# Patient Record
Sex: Female | Born: 1960 | Race: White | Hispanic: No | Smoking: Never smoker
Health system: Southern US, Community
[De-identification: ages and names within clinical notes are randomized; demographics above are authoritative.]

## PROBLEM LIST (undated history)

## (undated) DIAGNOSIS — M5430 Sciatica, unspecified side: Secondary | ICD-10-CM

---

## 2000-04-16 ENCOUNTER — Encounter: Payer: Self-pay | Admitting: Urology

## 2000-04-16 ENCOUNTER — Inpatient Hospital Stay (HOSPITAL_COMMUNITY): Admission: EM | Admit: 2000-04-16 | Discharge: 2000-04-17 | Payer: Self-pay | Admitting: Emergency Medicine

## 2000-04-16 ENCOUNTER — Encounter: Payer: Self-pay | Admitting: Emergency Medicine

## 2000-05-03 ENCOUNTER — Encounter: Payer: Self-pay | Admitting: Urology

## 2000-05-03 ENCOUNTER — Ambulatory Visit (HOSPITAL_BASED_OUTPATIENT_CLINIC_OR_DEPARTMENT_OTHER): Admission: RE | Admit: 2000-05-03 | Discharge: 2000-05-03 | Payer: Self-pay | Admitting: Urology

## 2005-08-06 ENCOUNTER — Emergency Department (HOSPITAL_COMMUNITY): Admission: EM | Admit: 2005-08-06 | Discharge: 2005-08-07 | Payer: Self-pay | Admitting: *Deleted

## 2005-08-09 ENCOUNTER — Inpatient Hospital Stay (HOSPITAL_COMMUNITY): Admission: AD | Admit: 2005-08-09 | Discharge: 2005-08-11 | Payer: Self-pay | Admitting: Orthopaedic Surgery

## 2006-07-12 IMAGING — CR DG ANKLE 2V *L*
2 series · 2 of 2 positions shown · non-contrast
Comparison: none

CLINICAL DATA: Fell with ankle pain. 
 LEFT ANKLE - 2 VIEW:
 Two views of the left ankle were obtained.  There is tibiotalar dislocation with fracture of the medial malleolus and possibly posterior malleolus.  There is oblique angulated fracture of the distal left fibula.

[view not recorded (1 of 2)]
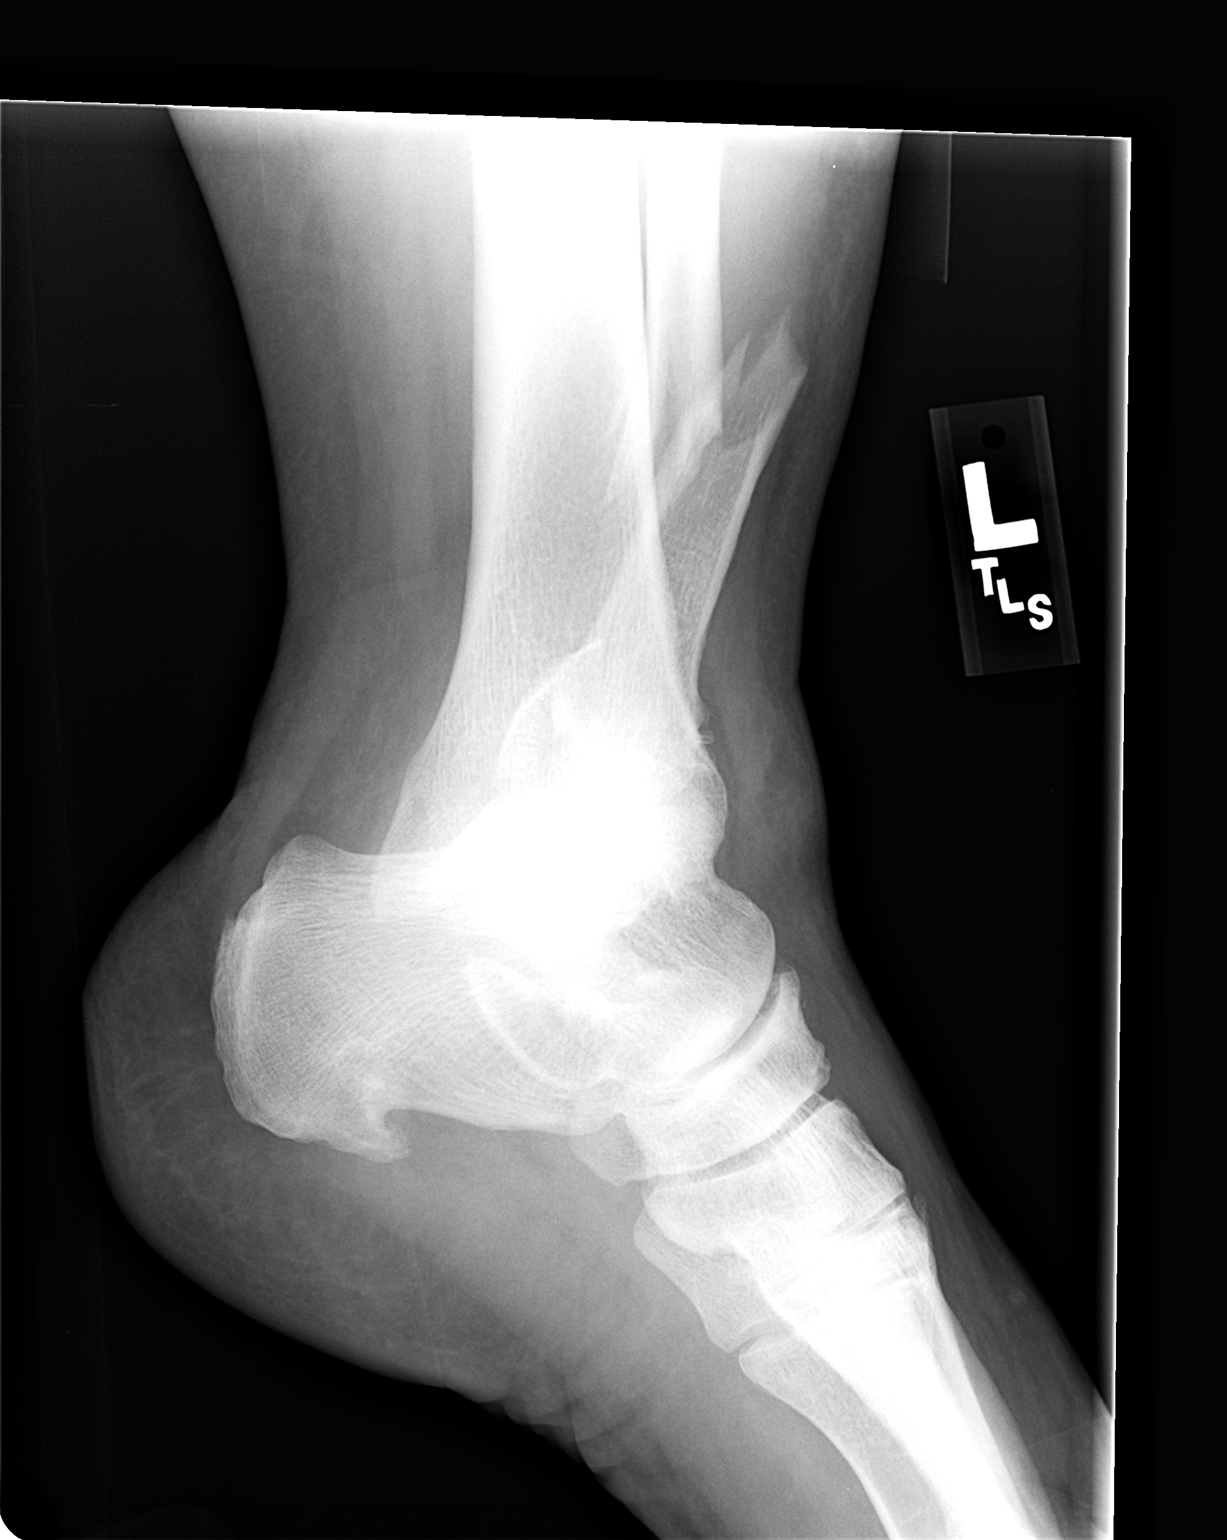

[view not recorded (2 of 2)]
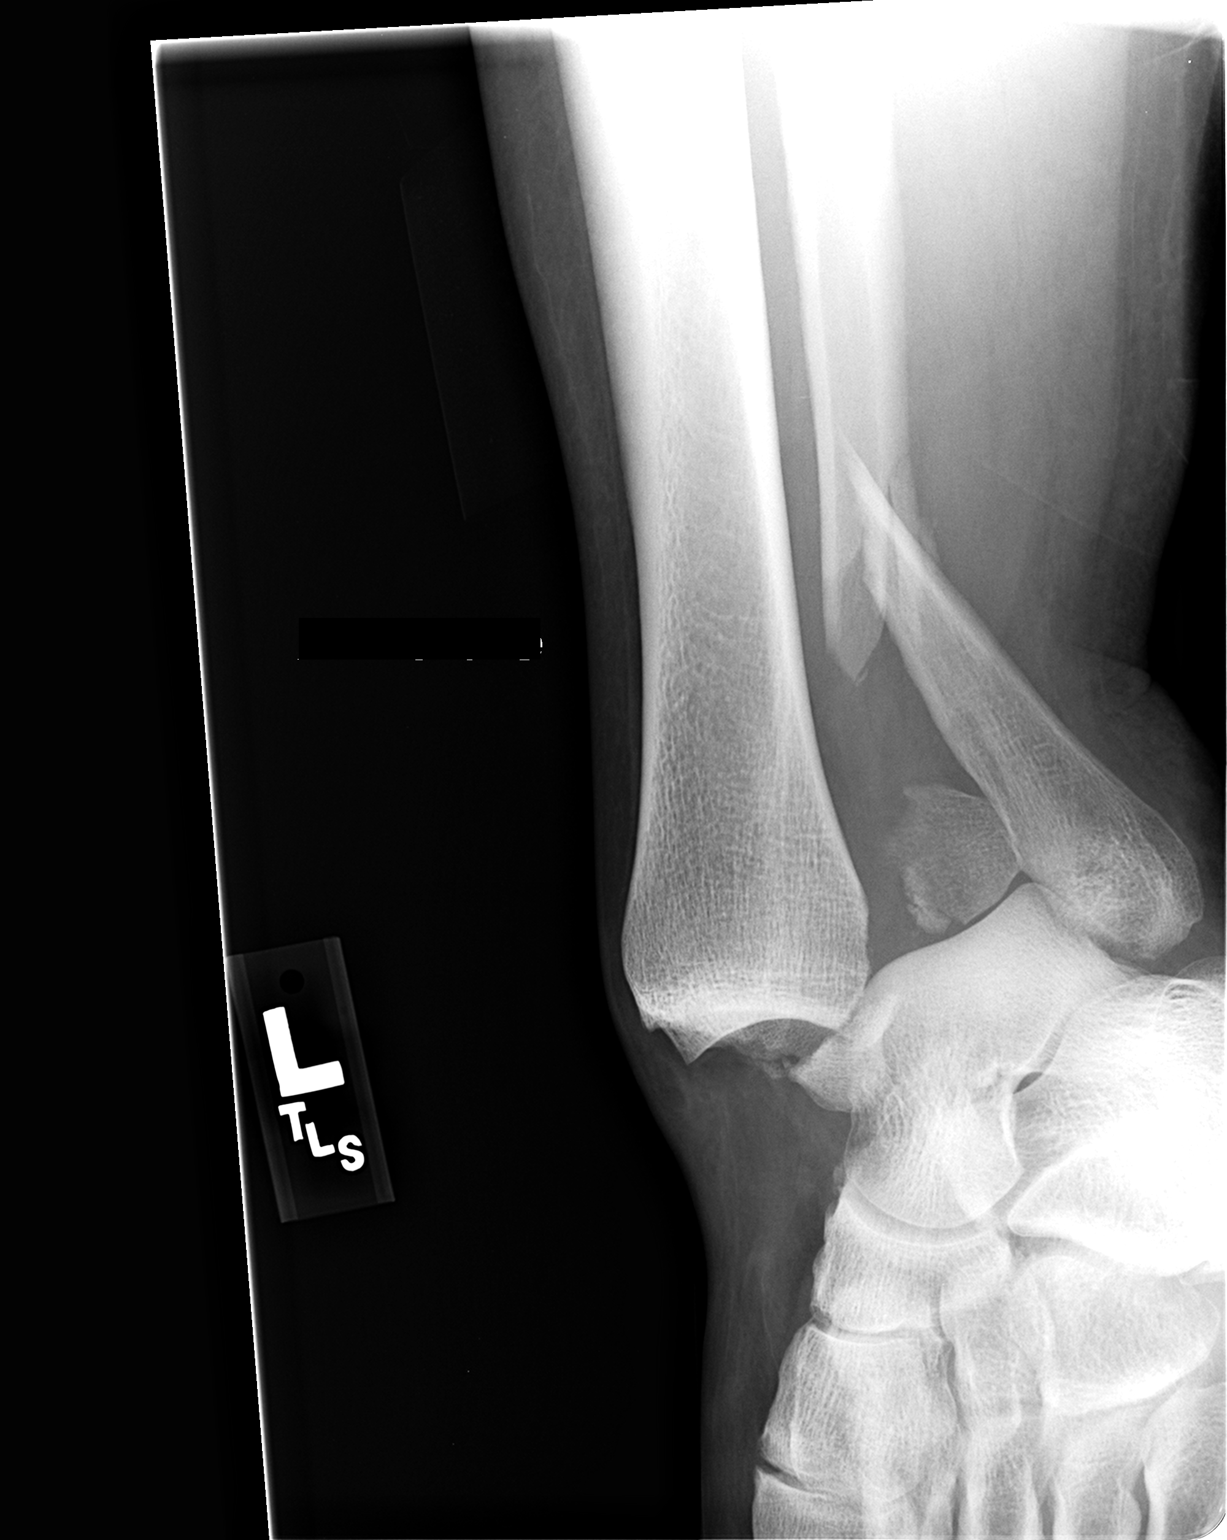

[2 of 2 positions shown; findings below may reference images not displayed]

IMPRESSION: Tibiotalar dislocation with fractures of the medial malleolus and possibly posterior malleolus as well as the distal left fibula.

## 2006-07-15 IMAGING — RF DG ANKLE 2V *L*
1 series · 2 of 2 positions shown · non-contrast
Comparison: none

CLINICAL DATA: Post-op open reduction internal fixation bimalleolar fracture left ankle. 
 LEFT ANKLE ? 2 VIEW:
 Two views of the left ankle made after open reduction internal fixation show excellent reduction in position of the medial malleolar fracture by a large metal screw.  The distal fibular fracture is fixed by a large metal plate and five small screws and two large screws which unite the fibula and distal tibia with good alignment of the ankle mortise.

[Series 1: run · 2 of 2 slices shown]
[im 1/2]
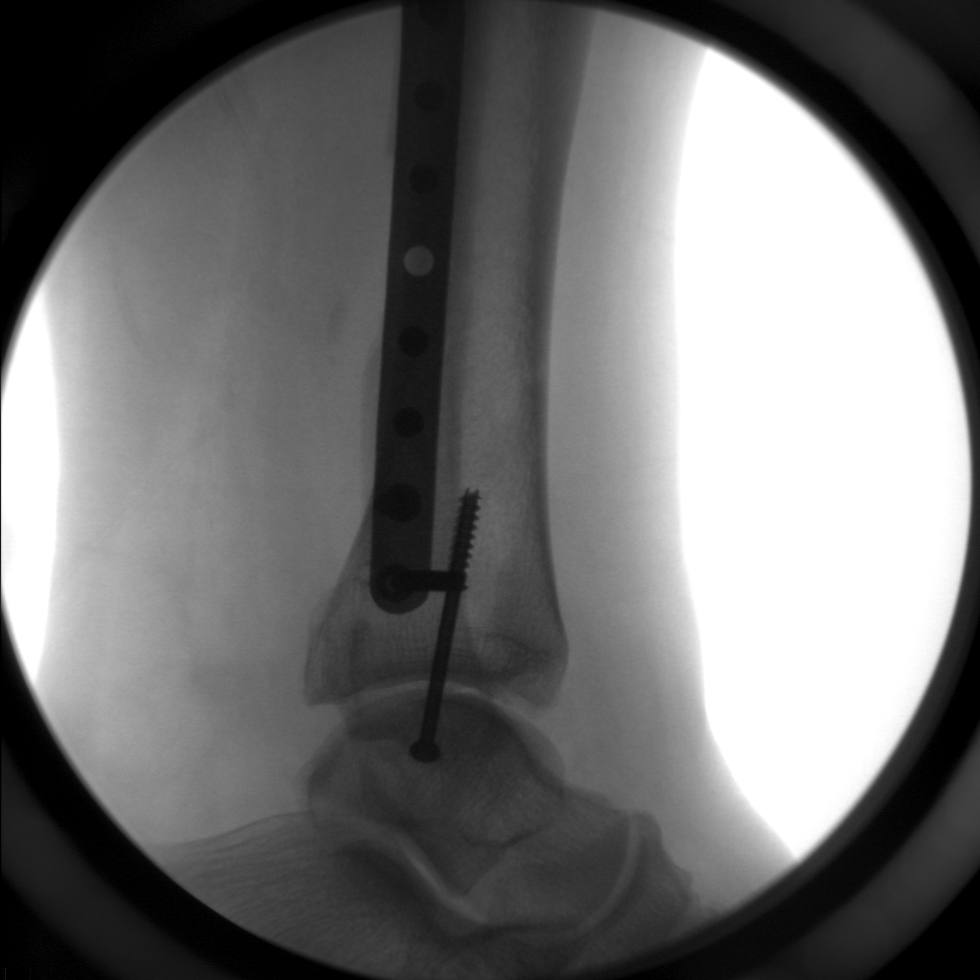
[im 2/2]
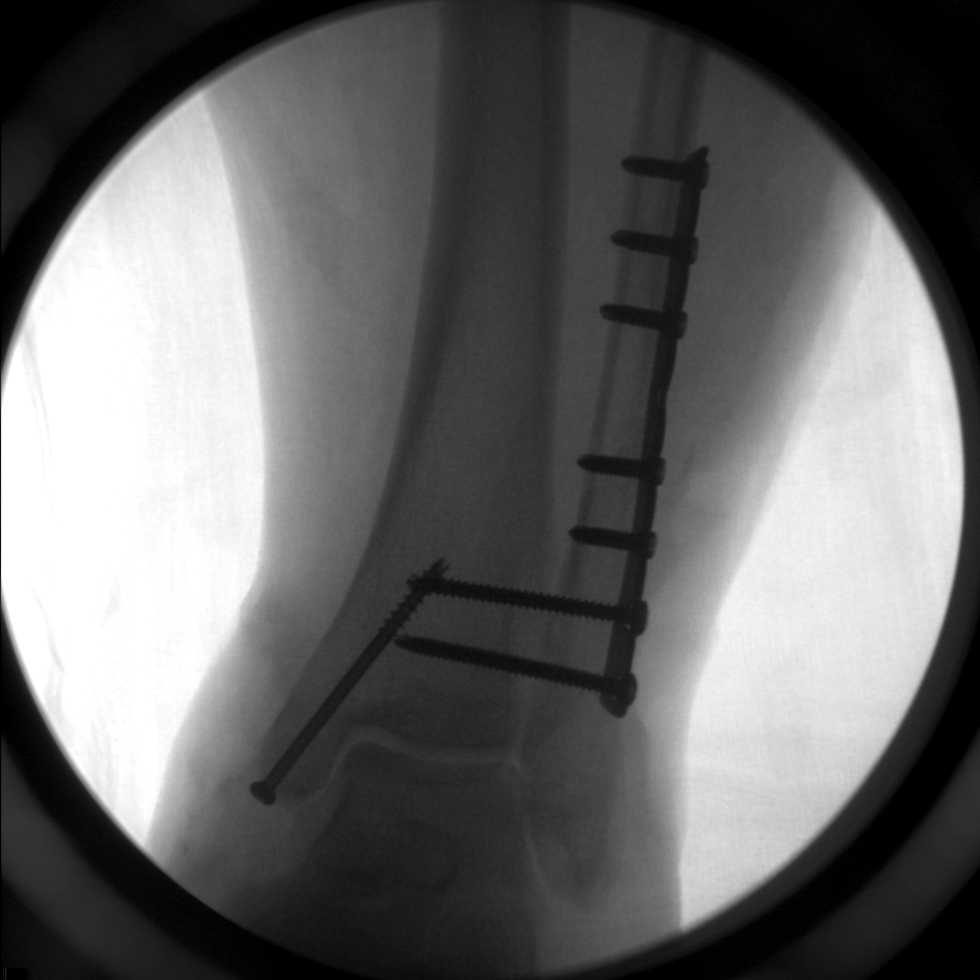

[2 of 2 positions shown; findings below may reference images not displayed]

IMPRESSION: Good post-op AP and lateral alignment, left distal tibial and fibular fracture.

## 2016-10-26 DIAGNOSIS — Z0000 Encounter for general adult medical examination without abnormal findings: Secondary | ICD-10-CM | POA: Diagnosis not present

## 2016-10-26 DIAGNOSIS — E039 Hypothyroidism, unspecified: Secondary | ICD-10-CM | POA: Diagnosis not present

## 2016-10-26 DIAGNOSIS — I10 Essential (primary) hypertension: Secondary | ICD-10-CM | POA: Diagnosis not present

## 2016-10-26 DIAGNOSIS — Z131 Encounter for screening for diabetes mellitus: Secondary | ICD-10-CM | POA: Diagnosis not present

## 2016-10-26 DIAGNOSIS — Z13220 Encounter for screening for lipoid disorders: Secondary | ICD-10-CM | POA: Diagnosis not present

## 2016-10-30 DIAGNOSIS — Z0000 Encounter for general adult medical examination without abnormal findings: Secondary | ICD-10-CM | POA: Diagnosis not present

## 2016-10-30 DIAGNOSIS — I10 Essential (primary) hypertension: Secondary | ICD-10-CM | POA: Diagnosis not present

## 2016-10-30 DIAGNOSIS — E039 Hypothyroidism, unspecified: Secondary | ICD-10-CM | POA: Diagnosis not present

## 2016-10-30 DIAGNOSIS — M12812 Other specific arthropathies, not elsewhere classified, left shoulder: Secondary | ICD-10-CM | POA: Diagnosis not present

## 2016-10-30 DIAGNOSIS — M25512 Pain in left shoulder: Secondary | ICD-10-CM | POA: Diagnosis not present

## 2016-11-22 DIAGNOSIS — Z6841 Body Mass Index (BMI) 40.0 and over, adult: Secondary | ICD-10-CM | POA: Diagnosis not present

## 2016-11-22 DIAGNOSIS — Z124 Encounter for screening for malignant neoplasm of cervix: Secondary | ICD-10-CM | POA: Diagnosis not present

## 2016-11-22 DIAGNOSIS — Z01419 Encounter for gynecological examination (general) (routine) without abnormal findings: Secondary | ICD-10-CM | POA: Diagnosis not present

## 2016-11-30 DIAGNOSIS — Z6841 Body Mass Index (BMI) 40.0 and over, adult: Secondary | ICD-10-CM | POA: Diagnosis not present

## 2016-11-30 DIAGNOSIS — Z1231 Encounter for screening mammogram for malignant neoplasm of breast: Secondary | ICD-10-CM | POA: Diagnosis not present

## 2016-12-11 DIAGNOSIS — J069 Acute upper respiratory infection, unspecified: Secondary | ICD-10-CM | POA: Diagnosis not present

## 2017-04-08 DIAGNOSIS — R197 Diarrhea, unspecified: Secondary | ICD-10-CM | POA: Diagnosis not present

## 2017-05-02 DIAGNOSIS — R499 Unspecified voice and resonance disorder: Secondary | ICD-10-CM | POA: Diagnosis not present

## 2017-05-02 DIAGNOSIS — R252 Cramp and spasm: Secondary | ICD-10-CM | POA: Diagnosis not present

## 2017-05-02 DIAGNOSIS — E039 Hypothyroidism, unspecified: Secondary | ICD-10-CM | POA: Diagnosis not present

## 2017-05-02 DIAGNOSIS — I10 Essential (primary) hypertension: Secondary | ICD-10-CM | POA: Diagnosis not present

## 2017-12-12 DIAGNOSIS — Z6841 Body Mass Index (BMI) 40.0 and over, adult: Secondary | ICD-10-CM | POA: Diagnosis not present

## 2017-12-12 DIAGNOSIS — Z01419 Encounter for gynecological examination (general) (routine) without abnormal findings: Secondary | ICD-10-CM | POA: Diagnosis not present

## 2017-12-12 DIAGNOSIS — Z1231 Encounter for screening mammogram for malignant neoplasm of breast: Secondary | ICD-10-CM | POA: Diagnosis not present

## 2017-12-12 DIAGNOSIS — Z124 Encounter for screening for malignant neoplasm of cervix: Secondary | ICD-10-CM | POA: Diagnosis not present

## 2018-03-24 DIAGNOSIS — E559 Vitamin D deficiency, unspecified: Secondary | ICD-10-CM | POA: Diagnosis not present

## 2018-03-24 DIAGNOSIS — Z131 Encounter for screening for diabetes mellitus: Secondary | ICD-10-CM | POA: Diagnosis not present

## 2018-03-24 DIAGNOSIS — Z0000 Encounter for general adult medical examination without abnormal findings: Secondary | ICD-10-CM | POA: Diagnosis not present

## 2018-03-24 DIAGNOSIS — R252 Cramp and spasm: Secondary | ICD-10-CM | POA: Diagnosis not present

## 2018-03-24 DIAGNOSIS — E039 Hypothyroidism, unspecified: Secondary | ICD-10-CM | POA: Diagnosis not present

## 2018-03-24 DIAGNOSIS — I10 Essential (primary) hypertension: Secondary | ICD-10-CM | POA: Diagnosis not present

## 2018-04-08 DIAGNOSIS — Z1212 Encounter for screening for malignant neoplasm of rectum: Secondary | ICD-10-CM | POA: Diagnosis not present

## 2018-04-08 DIAGNOSIS — Z1211 Encounter for screening for malignant neoplasm of colon: Secondary | ICD-10-CM | POA: Diagnosis not present

## 2018-10-14 DIAGNOSIS — I10 Essential (primary) hypertension: Secondary | ICD-10-CM | POA: Diagnosis not present

## 2018-10-14 DIAGNOSIS — E039 Hypothyroidism, unspecified: Secondary | ICD-10-CM | POA: Diagnosis not present

## 2018-10-14 DIAGNOSIS — R7309 Other abnormal glucose: Secondary | ICD-10-CM | POA: Diagnosis not present

## 2018-12-12 DIAGNOSIS — H25043 Posterior subcapsular polar age-related cataract, bilateral: Secondary | ICD-10-CM | POA: Diagnosis not present

## 2018-12-15 DIAGNOSIS — Z01818 Encounter for other preprocedural examination: Secondary | ICD-10-CM | POA: Diagnosis not present

## 2018-12-15 DIAGNOSIS — H25041 Posterior subcapsular polar age-related cataract, right eye: Secondary | ICD-10-CM | POA: Diagnosis not present

## 2018-12-25 DIAGNOSIS — Z6841 Body Mass Index (BMI) 40.0 and over, adult: Secondary | ICD-10-CM | POA: Diagnosis not present

## 2018-12-25 DIAGNOSIS — Z1231 Encounter for screening mammogram for malignant neoplasm of breast: Secondary | ICD-10-CM | POA: Diagnosis not present

## 2018-12-25 DIAGNOSIS — Z01419 Encounter for gynecological examination (general) (routine) without abnormal findings: Secondary | ICD-10-CM | POA: Diagnosis not present

## 2018-12-25 DIAGNOSIS — Z124 Encounter for screening for malignant neoplasm of cervix: Secondary | ICD-10-CM | POA: Diagnosis not present

## 2019-01-01 DIAGNOSIS — H25811 Combined forms of age-related cataract, right eye: Secondary | ICD-10-CM | POA: Diagnosis not present

## 2019-01-01 DIAGNOSIS — H2511 Age-related nuclear cataract, right eye: Secondary | ICD-10-CM | POA: Diagnosis not present

## 2019-01-01 DIAGNOSIS — H25041 Posterior subcapsular polar age-related cataract, right eye: Secondary | ICD-10-CM | POA: Diagnosis not present

## 2019-01-29 DIAGNOSIS — H25812 Combined forms of age-related cataract, left eye: Secondary | ICD-10-CM | POA: Diagnosis not present

## 2019-01-29 DIAGNOSIS — H2512 Age-related nuclear cataract, left eye: Secondary | ICD-10-CM | POA: Diagnosis not present

## 2019-01-29 DIAGNOSIS — H25042 Posterior subcapsular polar age-related cataract, left eye: Secondary | ICD-10-CM | POA: Diagnosis not present

## 2019-01-30 DIAGNOSIS — Z23 Encounter for immunization: Secondary | ICD-10-CM | POA: Diagnosis not present

## 2019-07-29 DIAGNOSIS — Z20828 Contact with and (suspected) exposure to other viral communicable diseases: Secondary | ICD-10-CM | POA: Diagnosis not present

## 2019-08-03 DIAGNOSIS — Z20828 Contact with and (suspected) exposure to other viral communicable diseases: Secondary | ICD-10-CM | POA: Diagnosis not present

## 2019-08-14 DIAGNOSIS — Z20828 Contact with and (suspected) exposure to other viral communicable diseases: Secondary | ICD-10-CM | POA: Diagnosis not present

## 2019-08-31 DIAGNOSIS — H26493 Other secondary cataract, bilateral: Secondary | ICD-10-CM | POA: Diagnosis not present

## 2019-08-31 DIAGNOSIS — H5010 Unspecified exotropia: Secondary | ICD-10-CM | POA: Diagnosis not present

## 2019-08-31 DIAGNOSIS — H5021 Vertical strabismus, right eye: Secondary | ICD-10-CM | POA: Diagnosis not present

## 2019-08-31 DIAGNOSIS — H532 Diplopia: Secondary | ICD-10-CM | POA: Diagnosis not present

## 2019-08-31 DIAGNOSIS — H35371 Puckering of macula, right eye: Secondary | ICD-10-CM | POA: Diagnosis not present

## 2019-08-31 DIAGNOSIS — Z961 Presence of intraocular lens: Secondary | ICD-10-CM | POA: Diagnosis not present

## 2019-09-08 DIAGNOSIS — H43811 Vitreous degeneration, right eye: Secondary | ICD-10-CM | POA: Diagnosis not present

## 2019-09-08 DIAGNOSIS — H5232 Aniseikonia: Secondary | ICD-10-CM | POA: Diagnosis not present

## 2019-09-08 DIAGNOSIS — H35373 Puckering of macula, bilateral: Secondary | ICD-10-CM | POA: Diagnosis not present

## 2019-09-21 DIAGNOSIS — E662 Morbid (severe) obesity with alveolar hypoventilation: Secondary | ICD-10-CM | POA: Diagnosis not present

## 2019-09-21 DIAGNOSIS — E039 Hypothyroidism, unspecified: Secondary | ICD-10-CM | POA: Diagnosis not present

## 2019-09-21 DIAGNOSIS — I10 Essential (primary) hypertension: Secondary | ICD-10-CM | POA: Diagnosis not present

## 2019-09-21 DIAGNOSIS — Z6841 Body Mass Index (BMI) 40.0 and over, adult: Secondary | ICD-10-CM | POA: Diagnosis not present

## 2019-09-22 DIAGNOSIS — Z13220 Encounter for screening for lipoid disorders: Secondary | ICD-10-CM | POA: Diagnosis not present

## 2019-09-22 DIAGNOSIS — Z131 Encounter for screening for diabetes mellitus: Secondary | ICD-10-CM | POA: Diagnosis not present

## 2019-09-22 DIAGNOSIS — E039 Hypothyroidism, unspecified: Secondary | ICD-10-CM | POA: Diagnosis not present

## 2019-09-22 DIAGNOSIS — I10 Essential (primary) hypertension: Secondary | ICD-10-CM | POA: Diagnosis not present

## 2020-02-12 DIAGNOSIS — Z23 Encounter for immunization: Secondary | ICD-10-CM | POA: Diagnosis not present

## 2020-03-14 DIAGNOSIS — Z01419 Encounter for gynecological examination (general) (routine) without abnormal findings: Secondary | ICD-10-CM | POA: Diagnosis not present

## 2020-03-14 DIAGNOSIS — Z6841 Body Mass Index (BMI) 40.0 and over, adult: Secondary | ICD-10-CM | POA: Diagnosis not present

## 2020-03-14 DIAGNOSIS — Z124 Encounter for screening for malignant neoplasm of cervix: Secondary | ICD-10-CM | POA: Diagnosis not present

## 2020-03-14 DIAGNOSIS — Z1231 Encounter for screening mammogram for malignant neoplasm of breast: Secondary | ICD-10-CM | POA: Diagnosis not present

## 2020-03-31 DIAGNOSIS — I10 Essential (primary) hypertension: Secondary | ICD-10-CM | POA: Diagnosis not present

## 2020-03-31 DIAGNOSIS — E039 Hypothyroidism, unspecified: Secondary | ICD-10-CM | POA: Diagnosis not present

## 2020-03-31 DIAGNOSIS — Z6841 Body Mass Index (BMI) 40.0 and over, adult: Secondary | ICD-10-CM | POA: Diagnosis not present

## 2020-09-30 DIAGNOSIS — I10 Essential (primary) hypertension: Secondary | ICD-10-CM | POA: Diagnosis not present

## 2020-09-30 DIAGNOSIS — Z6841 Body Mass Index (BMI) 40.0 and over, adult: Secondary | ICD-10-CM | POA: Diagnosis not present

## 2021-06-14 ENCOUNTER — Emergency Department (HOSPITAL_COMMUNITY)
Admission: EM | Admit: 2021-06-14 | Discharge: 2021-06-14 | Disposition: A | Discharge status: Home / Self Care | Payer: 59 | Attending: Emergency Medicine | Admitting: Emergency Medicine

## 2021-06-14 ENCOUNTER — Encounter (HOSPITAL_COMMUNITY): Payer: Self-pay | Admitting: Emergency Medicine

## 2021-06-14 DIAGNOSIS — M5441 Lumbago with sciatica, right side: Secondary | ICD-10-CM | POA: Insufficient documentation

## 2021-06-14 HISTORY — DX: Sciatica, unspecified side: M54.30

## 2021-06-14 MED ORDER — IBUPROFEN 400 MG PO TABS
600.0000 mg | ORAL_TABLET | Freq: Once | ORAL | Status: AC
  Administered 2021-06-14: 600 mg via ORAL
  Filled 2021-06-14: qty 1

## 2021-06-14 MED ORDER — OXYCODONE-ACETAMINOPHEN 5-325 MG PO TABS: 1.0000 | ORAL_TABLET | Freq: Four times a day (QID) | ORAL | 0 refills | Status: DC | PRN

## 2021-06-14 MED ORDER — TIZANIDINE HCL 4 MG PO TABS
4.0000 mg | ORAL_TABLET | Freq: Once | ORAL | Status: AC
  Administered 2021-06-14: 4 mg via ORAL
  Filled 2021-06-14: qty 1

## 2021-06-14 MED ORDER — TIZANIDINE HCL 4 MG PO TABS: 2.0000 mg | ORAL_TABLET | Freq: Three times a day (TID) | ORAL | 0 refills | Status: AC | PRN

## 2021-06-14 MED ORDER — OXYCODONE-ACETAMINOPHEN 5-325 MG PO TABS
1.0000 | ORAL_TABLET | ORAL | Status: AC | PRN
  Administered 2021-06-14 (×2): 1 via ORAL
  Filled 2021-06-14 (×2): qty 1

## 2021-06-14 MED ORDER — HYDROMORPHONE HCL 1 MG/ML IJ SOLN
1.0000 mg | Freq: Once | INTRAMUSCULAR | Status: AC
  Administered 2021-06-14: 1 mg via INTRAMUSCULAR
  Filled 2021-06-14: qty 1

## 2021-06-14 MED ORDER — NAPROXEN 500 MG PO TABS: 500.0000 mg | ORAL_TABLET | Freq: Two times a day (BID) | ORAL | 0 refills | Status: AC

## 2021-06-14 NOTE — ED Provider Notes (Signed)
[facility] EMERGENCY DEPARTMENT Provider Note   CSN: [id number] Arrival date & time: 06/14/21  [id number]     History Chief Complaint  Patient presents with   Back Pain    Christy Schneider is a 60 y.o. female.  InjectionEnding of right-sided back pain rating down the right leg.  Symptoms been ongoing for about 2 weeks.  She states that she was on prednisone for a week initially it helped.  Then she was taking a shower 4 days ago and when she reached over to the right she felt a sharp pulling muscle on her right lower back rating down the right leg again.  Denies any fall or trauma.  She was able to go to an orthopedic urgent care yesterday and was given pain medication and had x-rays done.  She is scheduled for an MRI next week.  She presents to the ER today because she states the pain is not improved.  She otherwise denies fevers or cough or vomiting or diarrhea.  Denies loss of control of bowel or bladder function.  Denies any numbness in her groin or elsewhere.      Past Medical History:  Diagnosis Date   Sciatica     There are no problems to display for this patient.    OB History   No obstetric history on file.     No family history on file.     Home Medications Prior to Admission medications   Medication Sig Start Date End Date Taking? Authorizing Provider  naproxen (NAPROSYN) 500 MG tablet Take 1 tablet (500 mg total) by mouth 2 (two) times daily with a meal for 14 doses. 06/14/21 06/21/21 Yes Luna Fuse, MD  oxyCODONE-acetaminophen (PERCOCET/ROXICET) 5-325 MG tablet Take 1 tablet by mouth every 6 (six) hours as needed for up to 10 doses for severe pain. 06/14/21  Yes Alexsis Kathman, Greggory Brandy, MD  tiZANidine (ZANAFLEX) 4 MG tablet Take 0.5 tablets (2 mg total) by mouth every 8 (eight) hours as needed for up to 15 days for muscle spasms. 06/14/21 06/29/21 Yes Terran Klinke, Greggory Brandy, MD    Allergies    Patient has no allergy information on record.  Review of Systems    Review of Systems  Constitutional:  Negative for fever.  HENT:  Negative for ear pain.   Eyes:  Negative for pain.  Respiratory:  Negative for cough.   Cardiovascular:  Negative for chest pain.  Gastrointestinal:  Negative for abdominal pain.  Genitourinary:  Negative for flank pain.  Musculoskeletal:  Positive for back pain.  Skin:  Negative for rash.  Neurological:  Negative for headaches.   Physical Exam Updated Vital Signs BP 136/79 (BP Location: Right Arm)   Pulse 69   Temp 98.2 F (36.8 C)   Resp 15   SpO2 100%   Physical Exam Constitutional:      General: She is not in acute distress.    Appearance: Normal appearance.  HENT:     Head: Normocephalic.     Nose: Nose normal.  Eyes:     Extraocular Movements: Extraocular movements intact.  Cardiovascular:     Rate and Rhythm: Normal rate.  Pulmonary:     Effort: Pulmonary effort is normal.  Musculoskeletal:     Cervical back: Normal range of motion.     Comments: Tenderness palpation in the L2-3-4 region.  Worse in the right paraspinal region at this level.  Neurological:     General: No focal deficit present.  Mental Status: She is alert. Mental status is at baseline.     Comments: 5/5 strength all extremities.  No saddle anesthesia noted.  Normal perineal tone.    ED Results / Procedures / Treatments   Labs (all labs ordered are listed, but only abnormal results are displayed) Labs Reviewed - No data to display  EKG None  Radiology No results found.  Procedures Procedures   Medications Ordered in ED Medications  oxyCODONE-acetaminophen (PERCOCET/ROXICET) 5-325 MG per tablet 1 tablet (1 tablet Oral Given 06/14/21 0932)  ibuprofen (ADVIL) tablet 600 mg (600 mg Oral Given 06/14/21 0936)  tiZANidine (ZANAFLEX) tablet 4 mg (4 mg Oral Given 06/14/21 0936)  HYDROmorphone (DILAUDID) injection 1 mg (1 mg Intramuscular Given 06/14/21 [id number])    ED Course  I have reviewed the triage vital signs and the  nursing notes.  Pertinent labs & imaging results that were available during my care of the patient were reviewed by me and considered in my medical decision making (see chart for details).    MDM Rules/Calculators/A&P                           Patient has positive straight leg test in the right lower extremity.  Otherwise neurovascularly intact.  She has outpatient MRI scheduled which advised her to keep.  Given Dilaudid Norco Motrin and tizanidine with improvement of symptoms.  Will be discharged home stable condition, advised continued outpatient follow-up with orthopedic surgery.  Advising immediate return for worsening symptoms or any additional concerns.  Final Clinical Impression(s) / ED Diagnoses Final diagnoses:  Acute right-sided low back pain with right-sided sciatica    Rx / DC Orders ED Discharge Orders          Ordered    oxyCODONE-acetaminophen (PERCOCET/ROXICET) 5-325 MG tablet  Every 6 hours PRN        06/14/21 0952    tiZANidine (ZANAFLEX) 4 MG tablet  Every 8 hours PRN        06/14/21 0952    naproxen (NAPROSYN) 500 MG tablet  2 times daily with meals        06/14/21 0952             Luna Fuse, MD 06/14/21 [phone]

## 2021-06-14 NOTE — ED Triage Notes (Signed)
Pt endorses sciatic nerve back down her right leg. States she was on prednisone for a week and that helped. Pain worsened in shower Monday. Saw ortho doc yesterday and given a pain shot but no improvement.  Pt able to stand and pivot into wheelchair but states pain is unbearable unless flat.

## 2021-06-14 NOTE — Discharge Instructions (Addendum)
Call your primary care doctor or specialist as discussed in the next 2-3 days.   Return immediately back to the ER if:  Your symptoms worsen within the next 12-24 hours. You develop new symptoms such as new fevers, persistent vomiting, new pain, shortness of breath, or new weakness or numbness, or if you have any other concerns.  

## 2021-08-09 ENCOUNTER — Other Ambulatory Visit: Payer: Self-pay | Admitting: Gastroenterology

## 2021-10-17 DIAGNOSIS — H26493 Other secondary cataract, bilateral: Secondary | ICD-10-CM | POA: Diagnosis not present

## 2021-10-17 DIAGNOSIS — H35371 Puckering of macula, right eye: Secondary | ICD-10-CM | POA: Diagnosis not present

## 2021-11-07 DIAGNOSIS — H35371 Puckering of macula, right eye: Secondary | ICD-10-CM | POA: Diagnosis not present

## 2021-11-20 DIAGNOSIS — J329 Chronic sinusitis, unspecified: Secondary | ICD-10-CM | POA: Diagnosis not present

## 2021-11-20 DIAGNOSIS — Z20822 Contact with and (suspected) exposure to covid-19: Secondary | ICD-10-CM | POA: Diagnosis not present

## 2021-11-20 DIAGNOSIS — J029 Acute pharyngitis, unspecified: Secondary | ICD-10-CM | POA: Diagnosis not present

## 2021-11-20 DIAGNOSIS — J069 Acute upper respiratory infection, unspecified: Secondary | ICD-10-CM | POA: Diagnosis not present

## 2021-12-01 ENCOUNTER — Encounter (HOSPITAL_COMMUNITY): Payer: Self-pay | Admitting: Gastroenterology

## 2021-12-01 NOTE — Progress Notes (Signed)
Attempted to obtain medical history via telephone, unable to reach at this time. I left a voicemail to return pre surgical testing department's phone call.  

## 2021-12-07 DIAGNOSIS — Z01818 Encounter for other preprocedural examination: Secondary | ICD-10-CM | POA: Diagnosis not present

## 2021-12-12 ENCOUNTER — Ambulatory Visit (HOSPITAL_COMMUNITY): Payer: BC Managed Care – PPO | Admitting: Anesthesiology

## 2021-12-12 ENCOUNTER — Ambulatory Visit (HOSPITAL_COMMUNITY)
Admission: RE | Admit: 2021-12-12 | Discharge: 2021-12-12 | Disposition: A | Discharge status: Home / Self Care | Payer: BC Managed Care – PPO | Attending: Gastroenterology | Admitting: Gastroenterology

## 2021-12-12 ENCOUNTER — Encounter (HOSPITAL_COMMUNITY)
Admission: RE | Disposition: A | Discharge status: Home / Self Care | Payer: Self-pay | Source: Home / Self Care | Attending: Gastroenterology

## 2021-12-12 ENCOUNTER — Other Ambulatory Visit: Payer: Self-pay

## 2021-12-12 ENCOUNTER — Encounter (HOSPITAL_COMMUNITY): Payer: Self-pay | Admitting: Gastroenterology

## 2021-12-12 DIAGNOSIS — E039 Hypothyroidism, unspecified: Secondary | ICD-10-CM | POA: Insufficient documentation

## 2021-12-12 DIAGNOSIS — K649 Unspecified hemorrhoids: Secondary | ICD-10-CM | POA: Diagnosis not present

## 2021-12-12 DIAGNOSIS — D124 Benign neoplasm of descending colon: Secondary | ICD-10-CM | POA: Insufficient documentation

## 2021-12-12 DIAGNOSIS — K5730 Diverticulosis of large intestine without perforation or abscess without bleeding: Secondary | ICD-10-CM | POA: Insufficient documentation

## 2021-12-12 DIAGNOSIS — D123 Benign neoplasm of transverse colon: Secondary | ICD-10-CM | POA: Diagnosis not present

## 2021-12-12 DIAGNOSIS — Z6841 Body Mass Index (BMI) 40.0 and over, adult: Secondary | ICD-10-CM | POA: Insufficient documentation

## 2021-12-12 DIAGNOSIS — D128 Benign neoplasm of rectum: Secondary | ICD-10-CM | POA: Diagnosis not present

## 2021-12-12 DIAGNOSIS — Z1211 Encounter for screening for malignant neoplasm of colon: Secondary | ICD-10-CM | POA: Insufficient documentation

## 2021-12-12 HISTORY — PX: COLONOSCOPY WITH PROPOFOL: SHX5780

## 2021-12-12 HISTORY — PX: POLYPECTOMY: SHX5525

## 2021-12-12 HISTORY — PX: BIOPSY: SHX5522

## 2021-12-12 SURGERY — COLONOSCOPY WITH PROPOFOL
Anesthesia: Monitor Anesthesia Care

## 2021-12-12 MED ORDER — PROPOFOL 1000 MG/100ML IV EMUL
INTRAVENOUS | Status: AC
  Filled 2021-12-12: qty 100

## 2021-12-12 MED ORDER — LACTATED RINGERS IV SOLN: INTRAVENOUS | Status: DC

## 2021-12-12 MED ORDER — PROPOFOL 10 MG/ML IV BOLUS
INTRAVENOUS | Status: DC | PRN
  Administered 2021-12-12 (×2): 20 mg via INTRAVENOUS

## 2021-12-12 MED ORDER — PROPOFOL 500 MG/50ML IV EMUL
INTRAVENOUS | Status: DC | PRN
  Administered 2021-12-12: 125 ug/kg/min via INTRAVENOUS

## 2021-12-12 MED ORDER — SODIUM CHLORIDE 0.9 % IV SOLN: INTRAVENOUS | Status: DC

## 2021-12-12 MED ORDER — LIDOCAINE 2% (20 MG/ML) 5 ML SYRINGE
INTRAMUSCULAR | Status: DC | PRN
  Administered 2021-12-12: 50 mg via INTRAVENOUS

## 2021-12-12 SURGICAL SUPPLY — 22 items
ELECT REM PT RETURN 9FT ADLT (ELECTROSURGICAL)
ELECTRODE REM PT RTRN 9FT ADLT (ELECTROSURGICAL) IMPLANT
FCP BXJMBJMB 240X2.8X (CUTTING FORCEPS)
FLOOR PAD 36X40 (MISCELLANEOUS) ×4
FORCEPS BIOP RAD 4 LRG CAP 4 (CUTTING FORCEPS) IMPLANT
FORCEPS BIOP RJ4 240 W/NDL (CUTTING FORCEPS)
FORCEPS BXJMBJMB 240X2.8X (CUTTING FORCEPS) IMPLANT
INJECTOR/SNARE I SNARE (MISCELLANEOUS) IMPLANT
LUBRICANT JELLY 4.5OZ STERILE (MISCELLANEOUS) IMPLANT
MANIFOLD NEPTUNE II (INSTRUMENTS) IMPLANT
NDL SCLEROTHERAPY 25GX240 (NEEDLE) IMPLANT
NEEDLE SCLEROTHERAPY 25GX240 (NEEDLE) IMPLANT
PAD FLOOR 36X40 (MISCELLANEOUS) ×3 IMPLANT
PROBE APC STR FIRE (PROBE) IMPLANT
PROBE INJECTION GOLD (MISCELLANEOUS)
PROBE INJECTION GOLD 7FR (MISCELLANEOUS) IMPLANT
SNARE ROTATE MED OVAL 20MM (MISCELLANEOUS) IMPLANT
SYR 50ML LL SCALE MARK (SYRINGE) IMPLANT
TRAP SPECIMEN MUCOUS 40CC (MISCELLANEOUS) IMPLANT
TUBING ENDO SMARTCAP PENTAX (MISCELLANEOUS) IMPLANT
TUBING IRRIGATION ENDOGATOR (MISCELLANEOUS) ×5 IMPLANT
WATER STERILE IRR 1000ML POUR (IV SOLUTION) IMPLANT

## 2021-12-12 NOTE — Discharge Instructions (Signed)

## 2021-12-12 NOTE — H&P (Signed)
[facility] Admission Note  Chief Complaint: Screening colonoscopy  HPI: Christy Schneider is an 61 y.o. female.  Presenting average-risk colonoscopy.  No abdominal pain, change in bowel habits, blood in stool, unintentional weight loss.  No FHx colon cancer, polyps.  No prior colonoscopy.  Cologuard negative 2019 per patient report.  Past Medical History:  Diagnosis Date   Sciatica     History reviewed. No pertinent surgical history.  Medications Prior to Admission  Medication Sig Dispense Refill   Barberry-Oreg Grape-Goldenseal (BERBERINE COMPLEX PO) Take 1 capsule by mouth daily.     Boswellia Serrata (BOSWELLIA PO) Take 1 capsule by mouth daily.     cetirizine (ZYRTEC) 10 MG tablet Chew 10 mg by mouth daily.     Cholecalciferol 125 MCG (5000 UT) TABS Take 5,000 Units by mouth daily.     lisinopril-hydrochlorothiazide (ZESTORETIC) 20-12.5 MG tablet Take 1 tablet by mouth daily.     Magnesium Malate 1250 (141.7 Mg) MG TABS Take 1,250 mg by mouth daily.     Methylcobalamin (B-12) 5000 MCG TBDP Take 5,000 mcg by mouth daily.     Multiple Vitamin (MULTI-VITAMIN DAILY PO) Take 1 tablet by mouth daily.     NP THYROID 60 MG tablet Take 60 mg by mouth every morning.     potassium chloride SA (KLOR-CON) 20 MEQ tablet Take 20 mEq by mouth daily.     TURMERIC CURCUMIN PO Take 1 capsule by mouth daily.      Allergies:  Allergies  Allergen Reactions   Fd&C Yellow #5 [place] [Tartrazine]     Causes sinus infections when ingested    Sulfa Antibiotics Rash    History reviewed. No pertinent family history.  Social History:  reports that she has never smoked. She has never used smokeless tobacco. No history on file for alcohol use and drug use.   ROS: As per HPI, all others negative   Blood pressure (!) 156/82, pulse 75, temperature 97.8 F (36.6 C), temperature source Oral, resp. rate 15, height 5\' 6"  (1.676 m), weight (!) 138.8 kg, SpO2 99 %. General appearance:  overweight, NAD HEENT:  [place]/AT, anicteric NECK:  Thick, supple RESP:  CTA CV:  RRR ABD:  Protuberant, umb. Hernia, non-tender NEURO:  Non-focal, no encephalopathy  No results found for this or any previous visit (from the past 48 hour(s)). No results found.  Assessment/Plan   Colon cancer screening, hospital setting due to BMI. Risks (bleeding, infection, bowel perforation that could require surgery, sedation-related changes in cardiopulmonary systems), benefits (identification and possible treatment of source of symptoms, exclusion of certain causes of symptoms), and alternatives (watchful waiting, radiographic imaging studies, empiric medical treatment) of colonoscopy were explained to patient/family in detail and patient wishes to proceed.   Landry Dyke 12/12/2021, 10:39 AM

## 2021-12-12 NOTE — Anesthesia Preprocedure Evaluation (Addendum)
Anesthesia Evaluation  Patient identified by MRN, date of birth, ID band Patient awake    Reviewed: Allergy & Precautions, NPO status , Patient's Chart, lab work & pertinent test results  Airway Mallampati: II  TM Distance: >3 FB Neck ROM: Full    Dental no notable dental hx.    Pulmonary neg pulmonary ROS,    Pulmonary exam normal breath sounds clear to auscultation       Cardiovascular negative cardio ROS Normal cardiovascular exam Rhythm:Regular Rate:Normal     Neuro/Psych negative psych ROS   GI/Hepatic Neg liver ROS, Bowel prep,  Endo/Other  Hypothyroidism Morbid obesity  Renal/GU negative Renal ROS     Musculoskeletal negative musculoskeletal ROS (+)   Abdominal (+) + obese,   Peds  Hematology negative hematology ROS (+)   Anesthesia Other Findings Screening  Reproductive/Obstetrics                            Anesthesia Physical Anesthesia Plan  ASA: 3  Anesthesia Plan: MAC   Post-op Pain Management:    Induction:   PONV Risk Score and Plan: 2 and Propofol infusion and Treatment may vary due to age or medical condition  Airway Management Planned: Simple Face Mask  Additional Equipment:   Intra-op Plan:   Post-operative Plan:   Informed Consent: I have reviewed the patients History and Physical, chart, labs and discussed the procedure including the risks, benefits and alternatives for the proposed anesthesia with the patient or authorized representative who has indicated his/her understanding and acceptance.     Dental advisory given  Plan Discussed with: CRNA  Anesthesia Plan Comments:        Anesthesia Quick Evaluation

## 2021-12-12 NOTE — Anesthesia Postprocedure Evaluation (Signed)
Anesthesia Post Note  Patient: Jennesis Ramaswamy  Procedure(s) Performed: COLONOSCOPY WITH PROPOFOL POLYPECTOMY     Patient location during evaluation: Endoscopy Anesthesia Type: MAC Level of consciousness: awake Pain management: pain level controlled Vital Signs Assessment: post-procedure vital signs reviewed and stable Respiratory status: spontaneous breathing, nonlabored ventilation, respiratory function stable and patient connected to nasal cannula oxygen Cardiovascular status: stable and blood pressure returned to baseline Postop Assessment: no apparent nausea or vomiting Anesthetic complications: no   No notable events documented.  Last Vitals:  Vitals:   12/12/21 1141 12/12/21 1151  BP: (!) 112/47 (!) 124/57  Pulse: 65 61  Resp: 15 11  Temp:    SpO2: 98% 95%    Last Pain:  Vitals:   12/12/21 1151  TempSrc:   PainSc: 0-No pain                 Trev Boley P Matin Mattioli

## 2021-12-12 NOTE — Op Note (Signed)
[facility] Patient Name: Christy Schneider Procedure Date: 12/12/2021 MRN: [id number] Attending MD: Arta Silence , MD Date of Birth: January 11, 1961 CSN: [id number] Age: 61 Admit Type: Outpatient Procedure:                Colonoscopy Indications:              Screening for colorectal malignant neoplasm, This                            is the patient's first colonoscopy Providers:                Arta Silence, MD, Dulcy Fanny, Luan Moore, Technician, Dellie Catholic Referring MD:              Medicines:                Monitored Anesthesia Care Complications:            No immediate complications. Estimated Blood Loss:     Estimated blood loss: none. Procedure:                Pre-Anesthesia Assessment:                           - Prior to the procedure, a History and Physical                            was performed, and patient medications and                            allergies were reviewed. The patient's tolerance of                            previous anesthesia was also reviewed. The risks                            and benefits of the procedure and the sedation                            options and risks were discussed with the patient.                            All questions were answered, and informed consent                            was obtained. Prior Anticoagulants: The patient has                            taken no previous anticoagulant or antiplatelet                            agents. ASA Grade Assessment: III - A patient with  severe systemic disease. After reviewing the risks                            and benefits, the patient was deemed in                            satisfactory condition to undergo the procedure.                           After obtaining informed consent, the colonoscope                            was passed under direct vision. Throughout the                             procedure, the patient's blood pressure, pulse, and                            oxygen saturations were monitored continuously. The                            CF-HQ190L ([id number]) Olympus colonoscope was                            introduced through the anus and advanced to the the                            cecum, identified by appendiceal orifice and                            ileocecal valve. The ileocecal valve, appendiceal                            orifice, and rectum were photographed. The entire                            colon was examined. The colonoscopy was performed                            without difficulty. The patient tolerated the                            procedure well. The quality of the bowel                            preparation was good. Scope In: 10:53:55 AM Scope Out: 11:23:04 AM Scope Withdrawal Time: 0 hours 24 minutes 43 seconds  Total Procedure Duration: 0 hours 29 minutes 9 seconds  Findings:      Hemorrhoids were found on perianal exam.      A few small-mouthed diverticula were found in the sigmoid colon and       descending colon.      A few sessile polyps were found in the transverse colon and hepatic       flexure. The polyps were 4 to  6 mm in size. These polyps were removed       with a cold snare. Resection and retrieval were complete.      A 2 mm polyp was found in the transverse colon. The polyp was sessile.       The polyp was removed with a cold biopsy forceps. Resection and       retrieval were complete.      A 5 mm polyp was found in the descending colon. The polyp was sessile.       The polyp was removed with a cold snare. Resection and retrieval were       complete.      A 3 mm polyp was found in the descending colon. The polyp was sessile.       The polyp was removed with a cold biopsy forceps. Resection and       retrieval were complete.      A 8 mm polyp was found in the rectum. The polyp was sessile. The polyp       was removed with a  hot snare. Resection and retrieval were complete.      Colon otherwise normal; no other polyps, masses, vascular ectasias, or       inflammatory changes were seen.      The retroflexed view of the distal rectum and anal verge was normal and       showed no anal or rectal abnormalities. Impression:               - Hemorrhoids found on perianal exam.                           - Diverticulosis in the sigmoid colon and in the                            descending colon.                           - A few 4 to 6 mm polyps in the transverse colon                            and at the hepatic flexure, removed with a cold                            snare. Resected and retrieved.                           - One 2 mm polyp in the transverse colon, removed                            with a cold biopsy forceps. Resected and retrieved.                           - One 5 mm polyp in the descending colon, removed                            with a cold snare. Resected and retrieved.                           -  One 3 mm polyp in the descending colon, removed                            with a cold biopsy forceps. Resected and retrieved.                           - One 8 mm polyp in the rectum, removed with a hot                            snare. Resected and retrieved.                           - The distal rectum and anal verge are normal on                            retroflexion view.                           - The examination was otherwise normal. Moderate Sedation:      Not Applicable - Patient had care per Anesthesia. Recommendation:           - Patient has a contact number available for                            emergencies. The signs and symptoms of potential                            delayed complications were discussed with the                            patient. Return to normal activities tomorrow.                            Written discharge instructions were provided to the                             patient.                           - Discharge patient to home (ambulatory).                           - High fiber diet indefinitely.                           - Continue present medications.                           - Await pathology results.                           - Repeat colonoscopy (date not yet determined) for                            surveillance based on pathology results.                           -  Return to GI clinic PRN.                           - Return to referring physician as previously                            scheduled. Procedure Code(s):        --- Professional ---                           [phone], Colonoscopy, flexible; with removal of                            tumor(s), polyp(s), or other lesion(s) by snare                            technique                           [id number], 39, Colonoscopy, flexible; with biopsy,                            single or multiple Diagnosis Code(s):        --- Professional ---                           Z12.11, Encounter for screening for malignant                            neoplasm of colon                           K64.9, Unspecified hemorrhoids                           K63.5, Polyp of colon                           K62.1, Rectal polyp                           K57.30, Diverticulosis of large intestine without                            perforation or abscess without bleeding CPT copyright 2019 American Medical Association. All rights reserved. The codes documented in this report are preliminary and upon coder review may  be revised to meet current compliance requirements. Arta Silence, MD 12/12/2021 11:29:38 AM This report has been signed electronically. Number of Addenda: 0

## 2021-12-12 NOTE — Transfer of Care (Signed)
Immediate Anesthesia Transfer of Care Note  Patient: Christy Schneider  Procedure(s) Performed: COLONOSCOPY WITH PROPOFOL POLYPECTOMY  Patient Location: Endoscopy Unit  Anesthesia Type:MAC  Level of Consciousness: awake, alert , oriented and patient cooperative  Airway & Oxygen Therapy: Patient Spontanous Breathing and Patient connected to face mask  Post-op Assessment: Report given to RN and Post -op Vital signs reviewed and stable  Post vital signs: Reviewed and stable  Last Vitals:  Vitals Value Taken Time  BP 118/62 12/12/21 1130  Temp    Pulse 70 12/12/21 1130  Resp 16 12/12/21 1130  SpO2 100 % 12/12/21 1130  Vitals shown include unvalidated device data.  Last Pain:  Vitals:   12/12/21 0941  TempSrc: Oral  PainSc: 0-No pain         Complications: No notable events documented.

## 2021-12-12 NOTE — Anesthesia Procedure Notes (Signed)
Procedure Name: MAC Date/Time: 12/12/2021 10:44 AM Performed by: Claudia Desanctis, CRNA Pre-anesthesia Checklist: Patient identified, Emergency Drugs available, Suction available and Patient being monitored Patient Re-evaluated:Patient Re-evaluated prior to induction Oxygen Delivery Method: Simple face mask

## 2021-12-13 ENCOUNTER — Encounter (HOSPITAL_COMMUNITY): Payer: Self-pay | Admitting: Gastroenterology

## 2021-12-13 LAB — SURGICAL PATHOLOGY

## 2021-12-19 DIAGNOSIS — H35371 Puckering of macula, right eye: Secondary | ICD-10-CM | POA: Diagnosis not present

## 2022-01-05 DIAGNOSIS — H35371 Puckering of macula, right eye: Secondary | ICD-10-CM | POA: Diagnosis not present

## 2022-01-12 DIAGNOSIS — M179 Osteoarthritis of knee, unspecified: Secondary | ICD-10-CM | POA: Diagnosis not present

## 2022-01-12 DIAGNOSIS — E039 Hypothyroidism, unspecified: Secondary | ICD-10-CM | POA: Diagnosis not present

## 2022-01-12 DIAGNOSIS — I10 Essential (primary) hypertension: Secondary | ICD-10-CM | POA: Diagnosis not present

## 2022-02-05 DIAGNOSIS — D485 Neoplasm of uncertain behavior of skin: Secondary | ICD-10-CM | POA: Diagnosis not present

## 2022-02-05 DIAGNOSIS — L820 Inflamed seborrheic keratosis: Secondary | ICD-10-CM | POA: Diagnosis not present

## 2022-02-05 DIAGNOSIS — L821 Other seborrheic keratosis: Secondary | ICD-10-CM | POA: Diagnosis not present

## 2022-02-05 DIAGNOSIS — D2239 Melanocytic nevi of other parts of face: Secondary | ICD-10-CM | POA: Diagnosis not present

## 2022-02-05 DIAGNOSIS — D2361 Other benign neoplasm of skin of right upper limb, including shoulder: Secondary | ICD-10-CM | POA: Diagnosis not present

## 2022-02-05 DIAGNOSIS — D224 Melanocytic nevi of scalp and neck: Secondary | ICD-10-CM | POA: Diagnosis not present

## 2022-02-05 DIAGNOSIS — D2272 Melanocytic nevi of left lower limb, including hip: Secondary | ICD-10-CM | POA: Diagnosis not present

## 2022-02-05 DIAGNOSIS — L570 Actinic keratosis: Secondary | ICD-10-CM | POA: Diagnosis not present

## 2022-02-05 DIAGNOSIS — D1801 Hemangioma of skin and subcutaneous tissue: Secondary | ICD-10-CM | POA: Diagnosis not present

## 2022-02-16 DIAGNOSIS — H35371 Puckering of macula, right eye: Secondary | ICD-10-CM | POA: Diagnosis not present

## 2022-03-15 DIAGNOSIS — D485 Neoplasm of uncertain behavior of skin: Secondary | ICD-10-CM | POA: Diagnosis not present

## 2022-03-15 DIAGNOSIS — L988 Other specified disorders of the skin and subcutaneous tissue: Secondary | ICD-10-CM | POA: Diagnosis not present

## 2022-03-30 DIAGNOSIS — E039 Hypothyroidism, unspecified: Secondary | ICD-10-CM | POA: Diagnosis not present

## 2022-03-30 DIAGNOSIS — M179 Osteoarthritis of knee, unspecified: Secondary | ICD-10-CM | POA: Diagnosis not present

## 2022-03-30 DIAGNOSIS — I10 Essential (primary) hypertension: Secondary | ICD-10-CM | POA: Diagnosis not present

## 2022-04-04 ENCOUNTER — Other Ambulatory Visit: Payer: Self-pay | Admitting: Family Medicine

## 2022-04-04 DIAGNOSIS — R946 Abnormal results of thyroid function studies: Secondary | ICD-10-CM

## 2022-04-10 ENCOUNTER — Ambulatory Visit
Admission: RE | Admit: 2022-04-10 | Discharge: 2022-04-10 | Disposition: A | Discharge status: Home / Self Care | Payer: BC Managed Care – PPO | Source: Ambulatory Visit | Attending: Family Medicine | Admitting: Family Medicine

## 2022-04-10 DIAGNOSIS — R946 Abnormal results of thyroid function studies: Secondary | ICD-10-CM

## 2022-04-23 DIAGNOSIS — Z01419 Encounter for gynecological examination (general) (routine) without abnormal findings: Secondary | ICD-10-CM | POA: Diagnosis not present

## 2022-04-23 DIAGNOSIS — Z1231 Encounter for screening mammogram for malignant neoplasm of breast: Secondary | ICD-10-CM | POA: Diagnosis not present

## 2022-05-02 DIAGNOSIS — H35371 Puckering of macula, right eye: Secondary | ICD-10-CM | POA: Diagnosis not present

## 2022-05-08 DIAGNOSIS — D2271 Melanocytic nevi of right lower limb, including hip: Secondary | ICD-10-CM | POA: Diagnosis not present

## 2022-05-08 DIAGNOSIS — L820 Inflamed seborrheic keratosis: Secondary | ICD-10-CM | POA: Diagnosis not present

## 2022-05-08 DIAGNOSIS — D2361 Other benign neoplasm of skin of right upper limb, including shoulder: Secondary | ICD-10-CM | POA: Diagnosis not present

## 2022-05-08 DIAGNOSIS — D1801 Hemangioma of skin and subcutaneous tissue: Secondary | ICD-10-CM | POA: Diagnosis not present

## 2022-05-08 DIAGNOSIS — B360 Pityriasis versicolor: Secondary | ICD-10-CM | POA: Diagnosis not present

## 2022-07-09 DIAGNOSIS — R946 Abnormal results of thyroid function studies: Secondary | ICD-10-CM | POA: Diagnosis not present

## 2022-07-09 DIAGNOSIS — I10 Essential (primary) hypertension: Secondary | ICD-10-CM | POA: Diagnosis not present

## 2022-07-09 DIAGNOSIS — M179 Osteoarthritis of knee, unspecified: Secondary | ICD-10-CM | POA: Diagnosis not present

## 2022-07-09 DIAGNOSIS — E039 Hypothyroidism, unspecified: Secondary | ICD-10-CM | POA: Diagnosis not present

## 2022-07-13 DIAGNOSIS — E039 Hypothyroidism, unspecified: Secondary | ICD-10-CM | POA: Diagnosis not present

## 2022-07-13 DIAGNOSIS — I10 Essential (primary) hypertension: Secondary | ICD-10-CM | POA: Diagnosis not present

## 2022-07-13 DIAGNOSIS — R5383 Other fatigue: Secondary | ICD-10-CM | POA: Diagnosis not present

## 2022-08-03 DIAGNOSIS — R300 Dysuria: Secondary | ICD-10-CM | POA: Diagnosis not present

## 2022-08-03 DIAGNOSIS — N3000 Acute cystitis without hematuria: Secondary | ICD-10-CM | POA: Diagnosis not present

## 2022-08-24 DIAGNOSIS — I10 Essential (primary) hypertension: Secondary | ICD-10-CM | POA: Diagnosis not present

## 2022-08-24 DIAGNOSIS — E039 Hypothyroidism, unspecified: Secondary | ICD-10-CM | POA: Diagnosis not present

## 2022-08-24 DIAGNOSIS — R946 Abnormal results of thyroid function studies: Secondary | ICD-10-CM | POA: Diagnosis not present

## 2022-09-28 DIAGNOSIS — M179 Osteoarthritis of knee, unspecified: Secondary | ICD-10-CM | POA: Diagnosis not present

## 2022-09-28 DIAGNOSIS — E039 Hypothyroidism, unspecified: Secondary | ICD-10-CM | POA: Diagnosis not present

## 2022-09-28 DIAGNOSIS — I10 Essential (primary) hypertension: Secondary | ICD-10-CM | POA: Diagnosis not present

## 2022-09-28 DIAGNOSIS — R5383 Other fatigue: Secondary | ICD-10-CM | POA: Diagnosis not present

## 2022-11-07 DIAGNOSIS — D485 Neoplasm of uncertain behavior of skin: Secondary | ICD-10-CM | POA: Diagnosis not present

## 2022-11-07 DIAGNOSIS — D2262 Melanocytic nevi of left upper limb, including shoulder: Secondary | ICD-10-CM | POA: Diagnosis not present

## 2022-11-07 DIAGNOSIS — D2239 Melanocytic nevi of other parts of face: Secondary | ICD-10-CM | POA: Diagnosis not present

## 2022-11-07 DIAGNOSIS — L308 Other specified dermatitis: Secondary | ICD-10-CM | POA: Diagnosis not present

## 2022-11-07 DIAGNOSIS — D2261 Melanocytic nevi of right upper limb, including shoulder: Secondary | ICD-10-CM | POA: Diagnosis not present

## 2022-11-07 DIAGNOSIS — D23112 Other benign neoplasm of skin of right lower eyelid, including canthus: Secondary | ICD-10-CM | POA: Diagnosis not present

## 2022-12-06 DIAGNOSIS — E039 Hypothyroidism, unspecified: Secondary | ICD-10-CM | POA: Diagnosis not present

## 2022-12-06 DIAGNOSIS — I10 Essential (primary) hypertension: Secondary | ICD-10-CM | POA: Diagnosis not present

## 2022-12-06 DIAGNOSIS — M179 Osteoarthritis of knee, unspecified: Secondary | ICD-10-CM | POA: Diagnosis not present

## 2022-12-06 DIAGNOSIS — R5383 Other fatigue: Secondary | ICD-10-CM | POA: Diagnosis not present

## 2022-12-14 DIAGNOSIS — E039 Hypothyroidism, unspecified: Secondary | ICD-10-CM | POA: Diagnosis not present

## 2022-12-14 DIAGNOSIS — M179 Osteoarthritis of knee, unspecified: Secondary | ICD-10-CM | POA: Diagnosis not present

## 2022-12-14 DIAGNOSIS — R5383 Other fatigue: Secondary | ICD-10-CM | POA: Diagnosis not present

## 2022-12-14 DIAGNOSIS — I10 Essential (primary) hypertension: Secondary | ICD-10-CM | POA: Diagnosis not present

## 2022-12-18 DIAGNOSIS — U071 COVID-19: Secondary | ICD-10-CM | POA: Diagnosis not present

## 2023-03-16 IMAGING — US US THYROID
1 series · 14 of 25 positions shown · non-contrast
Comparison: None Available.

CLINICAL DATA: Abnormal thyroid exam

EXAM:
THYROID ULTRASOUND
TECHNIQUE: Ultrasound examination of the thyroid gland and adjacent soft
tissues was performed.

[Series 1: us thyroid · 0.06mm/px · 14 of 34 slices shown]
[im 1/34]
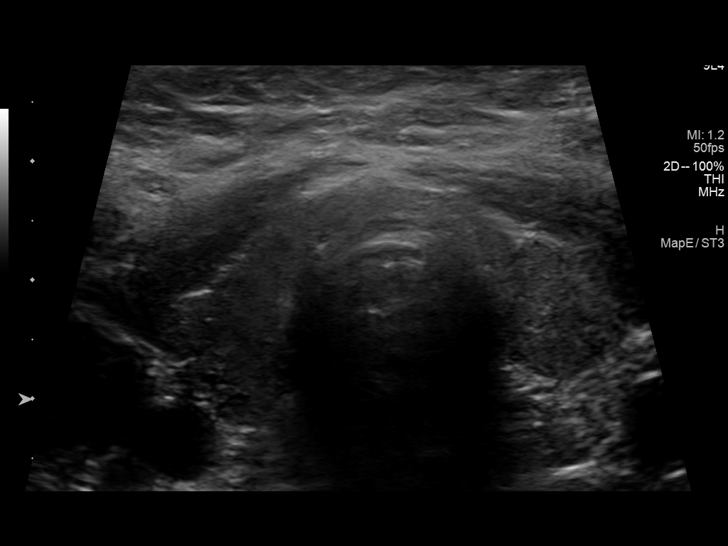
[im 3/34]
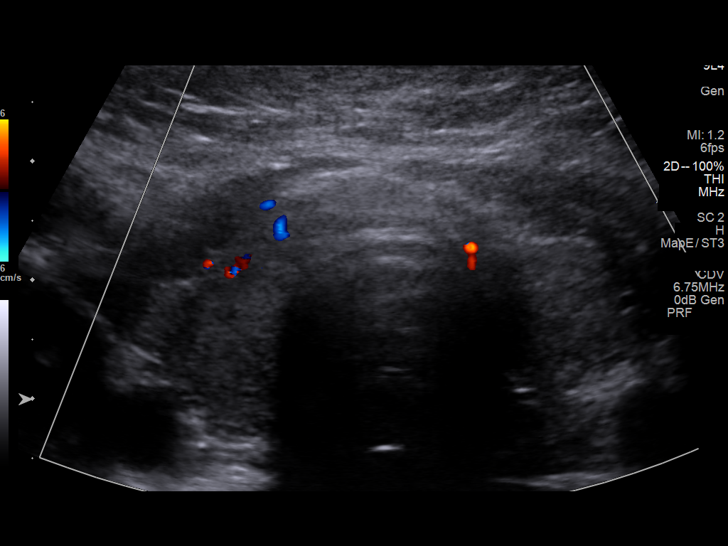
[im 6/34]
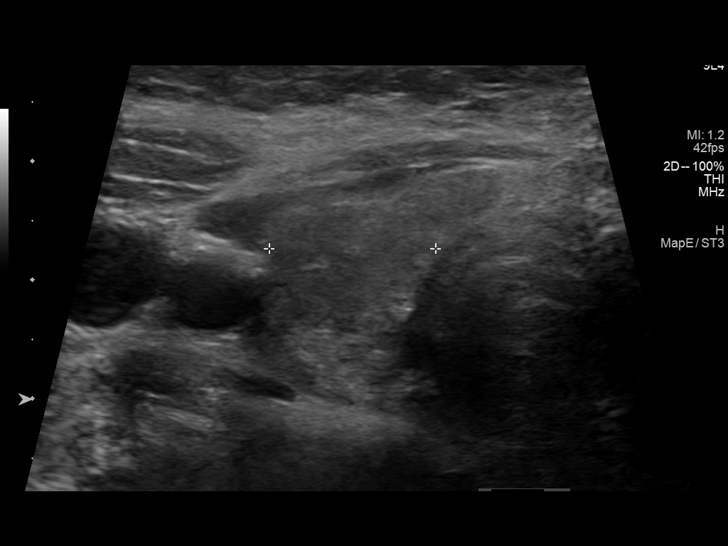
[im 9/34]
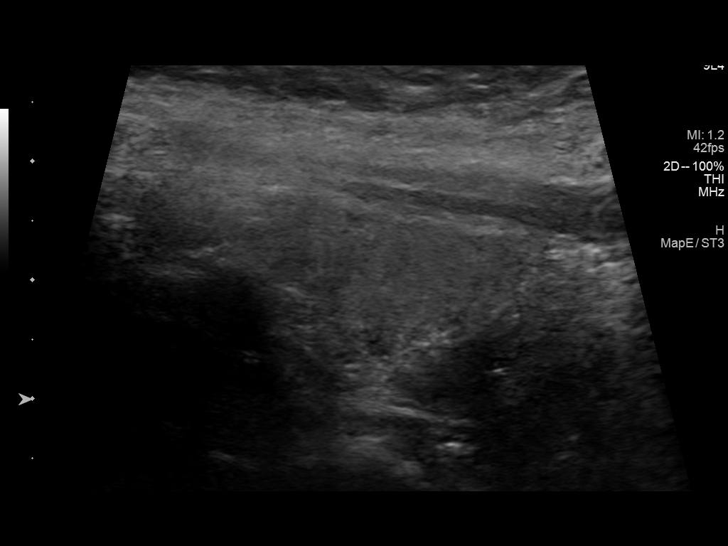
[im 12/34]
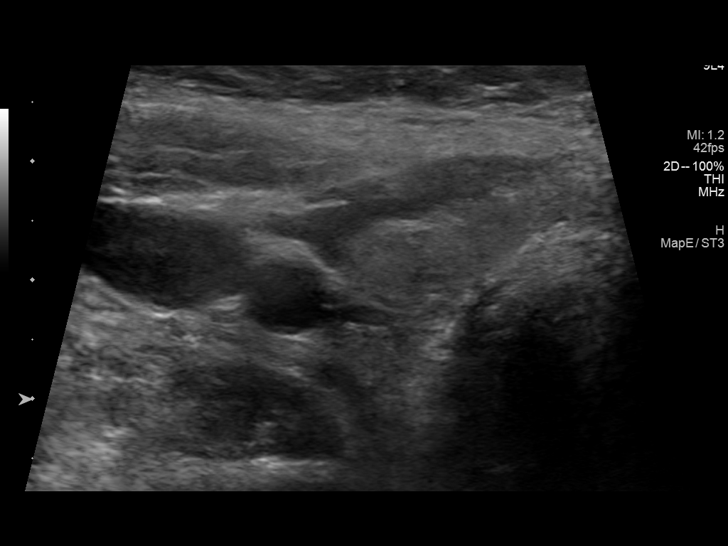
[im 13/34]
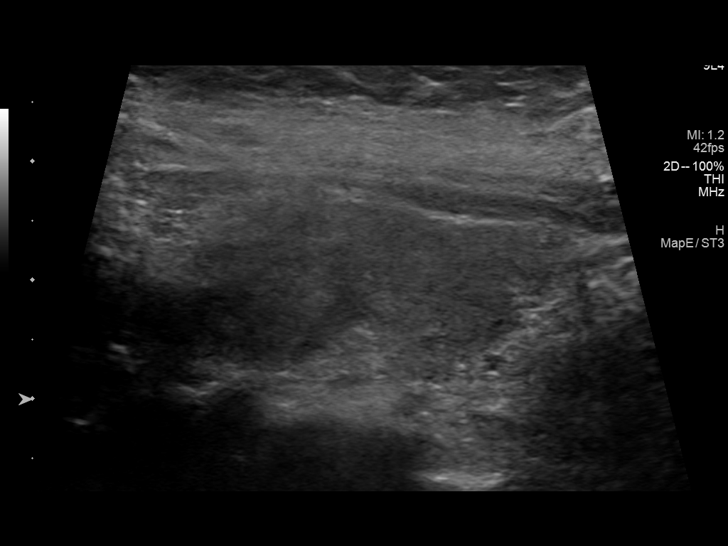
[im 16/34]
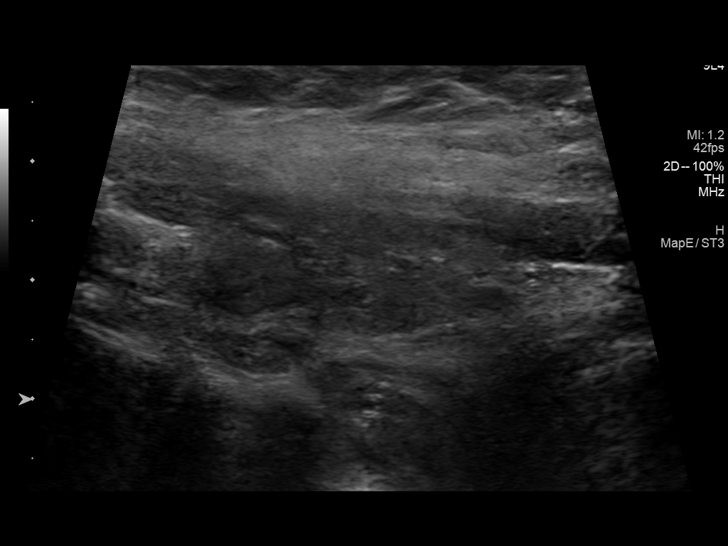
[im 18/34]
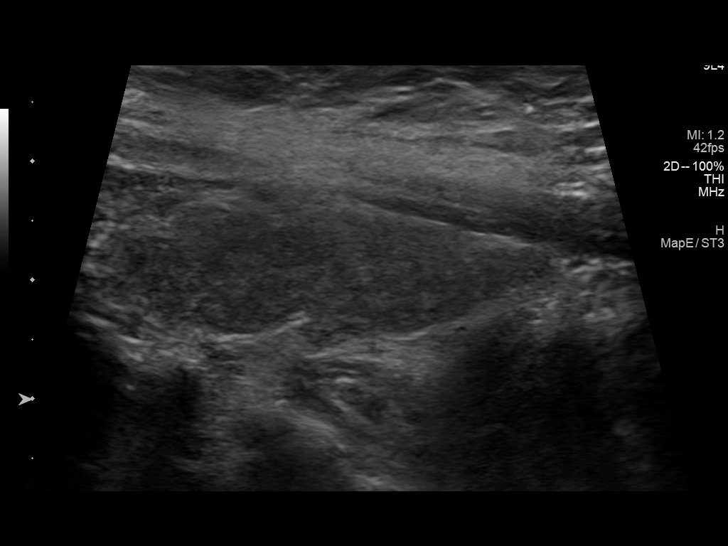
[im 21/34]
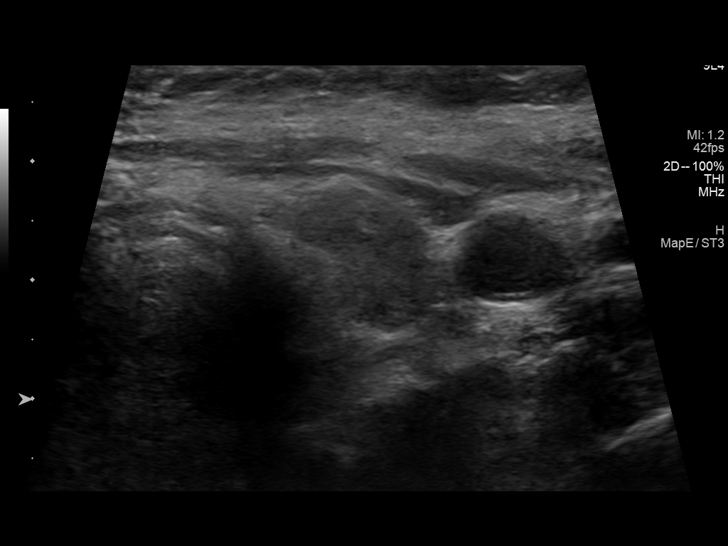
[im 23/34]
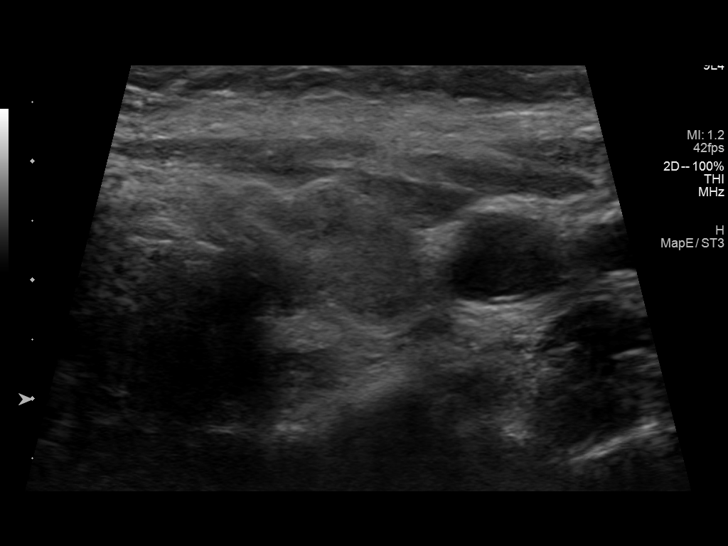
[im 25/34]
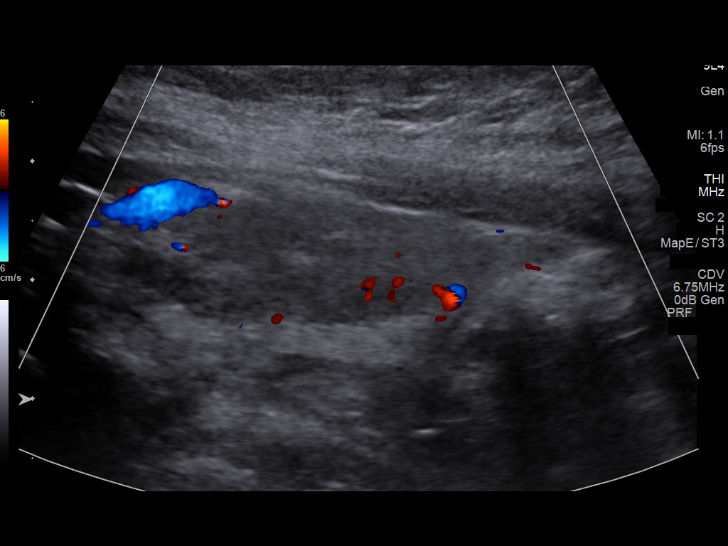
[im 28/34]
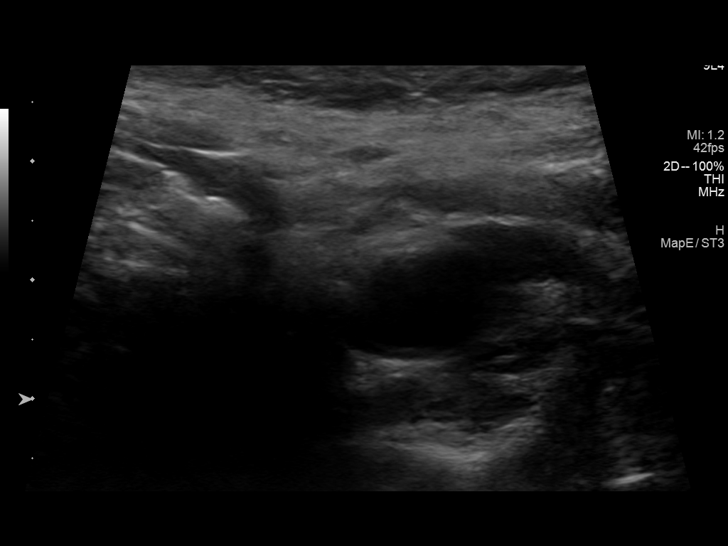
[im 31/34]
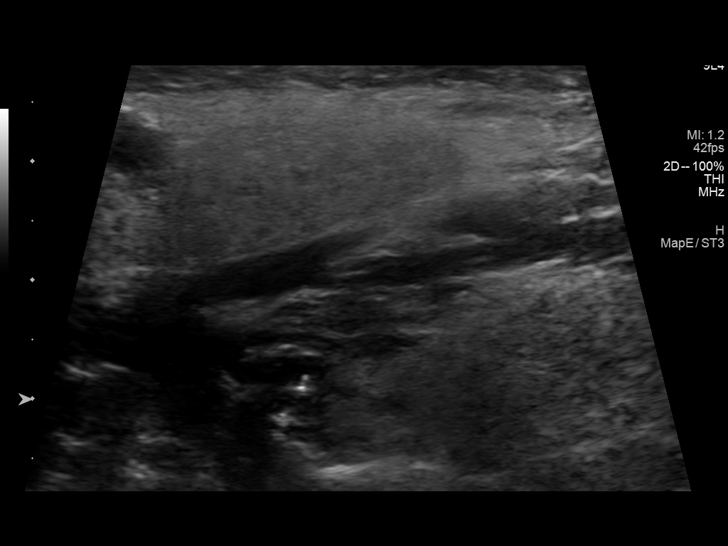
[im 34/34]
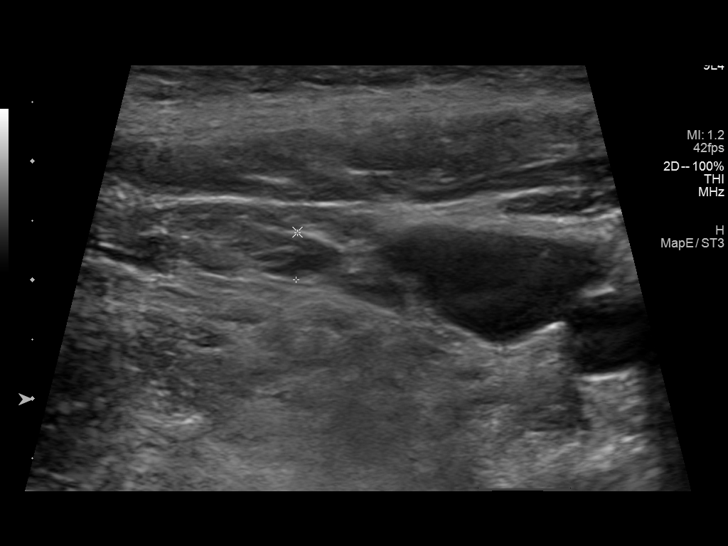

[14 of 25 positions shown; findings below may reference images not displayed]

FINDINGS: Parenchymal Echotexture: Mildly heterogenous

Isthmus: 0.3 cm

Right lobe: 3.3 x 1.2 x 1.4 cm

Left lobe: 3.6 x 1.3 x 1.1 cm

_________________________________________________________

Estimated total number of nodules >/= 1 cm: 0

Number of spongiform nodules >/=  2 cm not described below (TR1): 0

Number of mixed cystic and solid nodules >/= 1.5 cm not described
below (TR2): 0

_________________________________________________________

No discrete nodules are seen within the thyroid gland.
IMPRESSION: Slightly heterogeneous appearance of the thyroid gland, which is
otherwise normal in size without discrete nodule.

The above is in keeping with the ACR TI-RADS recommendations - [HOSPITAL] 7283;[DATE].

## 2023-04-17 DIAGNOSIS — R300 Dysuria: Secondary | ICD-10-CM | POA: Diagnosis not present

## 2023-04-23 DIAGNOSIS — Z0000 Encounter for general adult medical examination without abnormal findings: Secondary | ICD-10-CM | POA: Diagnosis not present

## 2023-04-23 DIAGNOSIS — E559 Vitamin D deficiency, unspecified: Secondary | ICD-10-CM | POA: Diagnosis not present

## 2023-04-23 DIAGNOSIS — I10 Essential (primary) hypertension: Secondary | ICD-10-CM | POA: Diagnosis not present

## 2023-04-23 DIAGNOSIS — E039 Hypothyroidism, unspecified: Secondary | ICD-10-CM | POA: Diagnosis not present

## 2023-04-23 DIAGNOSIS — R252 Cramp and spasm: Secondary | ICD-10-CM | POA: Diagnosis not present

## 2023-05-01 DIAGNOSIS — Z01419 Encounter for gynecological examination (general) (routine) without abnormal findings: Secondary | ICD-10-CM | POA: Diagnosis not present

## 2023-05-01 DIAGNOSIS — Z1231 Encounter for screening mammogram for malignant neoplasm of breast: Secondary | ICD-10-CM | POA: Diagnosis not present

## 2023-05-01 DIAGNOSIS — Z124 Encounter for screening for malignant neoplasm of cervix: Secondary | ICD-10-CM | POA: Diagnosis not present

## 2023-05-01 DIAGNOSIS — Z6841 Body Mass Index (BMI) 40.0 and over, adult: Secondary | ICD-10-CM | POA: Diagnosis not present

## 2023-05-01 DIAGNOSIS — R350 Frequency of micturition: Secondary | ICD-10-CM | POA: Diagnosis not present

## 2023-05-03 DIAGNOSIS — I10 Essential (primary) hypertension: Secondary | ICD-10-CM | POA: Diagnosis not present

## 2023-05-09 DIAGNOSIS — D2239 Melanocytic nevi of other parts of face: Secondary | ICD-10-CM | POA: Diagnosis not present

## 2023-05-09 DIAGNOSIS — D2261 Melanocytic nevi of right upper limb, including shoulder: Secondary | ICD-10-CM | POA: Diagnosis not present

## 2023-05-09 DIAGNOSIS — D2262 Melanocytic nevi of left upper limb, including shoulder: Secondary | ICD-10-CM | POA: Diagnosis not present

## 2023-05-09 DIAGNOSIS — L820 Inflamed seborrheic keratosis: Secondary | ICD-10-CM | POA: Diagnosis not present

## 2023-05-09 DIAGNOSIS — L570 Actinic keratosis: Secondary | ICD-10-CM | POA: Diagnosis not present

## 2023-05-09 DIAGNOSIS — D2272 Melanocytic nevi of left lower limb, including hip: Secondary | ICD-10-CM | POA: Diagnosis not present

## 2023-06-07 DIAGNOSIS — I10 Essential (primary) hypertension: Secondary | ICD-10-CM | POA: Diagnosis not present

## 2023-06-07 DIAGNOSIS — E039 Hypothyroidism, unspecified: Secondary | ICD-10-CM | POA: Diagnosis not present

## 2023-06-07 DIAGNOSIS — E559 Vitamin D deficiency, unspecified: Secondary | ICD-10-CM | POA: Diagnosis not present

## 2023-06-07 DIAGNOSIS — R252 Cramp and spasm: Secondary | ICD-10-CM | POA: Diagnosis not present

## 2023-06-07 DIAGNOSIS — Z13220 Encounter for screening for lipoid disorders: Secondary | ICD-10-CM | POA: Diagnosis not present

## 2023-06-13 DIAGNOSIS — H5581 Saccadic eye movements: Secondary | ICD-10-CM | POA: Diagnosis not present

## 2023-06-13 DIAGNOSIS — H4911 Fourth [trochlear] nerve palsy, right eye: Secondary | ICD-10-CM | POA: Diagnosis not present

## 2023-06-13 DIAGNOSIS — H532 Diplopia: Secondary | ICD-10-CM | POA: Diagnosis not present

## 2023-07-19 DIAGNOSIS — R8271 Bacteriuria: Secondary | ICD-10-CM | POA: Diagnosis not present

## 2023-07-19 DIAGNOSIS — N3020 Other chronic cystitis without hematuria: Secondary | ICD-10-CM | POA: Diagnosis not present

## 2023-09-26 DIAGNOSIS — H5581 Saccadic eye movements: Secondary | ICD-10-CM | POA: Diagnosis not present

## 2023-09-26 DIAGNOSIS — H4911 Fourth [trochlear] nerve palsy, right eye: Secondary | ICD-10-CM | POA: Diagnosis not present

## 2023-09-26 DIAGNOSIS — H532 Diplopia: Secondary | ICD-10-CM | POA: Diagnosis not present

## 2023-10-02 DIAGNOSIS — R5383 Other fatigue: Secondary | ICD-10-CM | POA: Diagnosis not present

## 2023-10-02 DIAGNOSIS — Z20822 Contact with and (suspected) exposure to covid-19: Secondary | ICD-10-CM | POA: Diagnosis not present

## 2023-10-02 DIAGNOSIS — R059 Cough, unspecified: Secondary | ICD-10-CM | POA: Diagnosis not present

## 2023-10-23 DIAGNOSIS — I10 Essential (primary) hypertension: Secondary | ICD-10-CM | POA: Diagnosis not present

## 2023-10-23 DIAGNOSIS — N390 Urinary tract infection, site not specified: Secondary | ICD-10-CM | POA: Diagnosis not present

## 2023-10-23 DIAGNOSIS — E039 Hypothyroidism, unspecified: Secondary | ICD-10-CM | POA: Diagnosis not present

## 2023-11-05 DIAGNOSIS — H1032 Unspecified acute conjunctivitis, left eye: Secondary | ICD-10-CM | POA: Diagnosis not present

## 2024-11-26 ENCOUNTER — Ambulatory Visit (INDEPENDENT_AMBULATORY_CARE_PROVIDER_SITE_OTHER): Admitting: Physician Assistant

## 2024-11-26 ENCOUNTER — Other Ambulatory Visit (INDEPENDENT_AMBULATORY_CARE_PROVIDER_SITE_OTHER)

## 2024-11-26 ENCOUNTER — Encounter: Payer: Self-pay | Admitting: Physician Assistant

## 2024-11-26 DIAGNOSIS — M542 Cervicalgia: Secondary | ICD-10-CM

## 2024-11-26 DIAGNOSIS — G8929 Other chronic pain: Secondary | ICD-10-CM

## 2024-11-26 DIAGNOSIS — M25511 Pain in right shoulder: Secondary | ICD-10-CM

## 2024-11-26 MED ORDER — MELOXICAM 15 MG PO TABS: 15.0000 mg | ORAL_TABLET | Freq: Every day | ORAL | 0 refills | Status: AC

## 2024-11-26 NOTE — Progress Notes (Signed)
 "  Office Visit Note   Patient: Christy Schneider           Date of Birth: [birth year]           MRN: [id number] Visit Date: 11/26/2024              Requested by: No referring provider defined for this encounter. PCP: Pcp, No   Assessment & Plan: Visit Diagnoses:  1. Chronic right shoulder pain   2. Neck pain     Plan: Patient is a pleasant 64 year old woman comes in today with a 1 month history of right shoulder pain.  Denies any injury.  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  She has had a lot of ongoing problems with her left shoulder but it is her right shoulder that has her concerned today she is left-hand dominant.  She also complains of some neck pain.  Again the nature of her job she thinks this makes this worse.  Exam today is most consistent with rotator cuff tendinopathy or tendinitis.  We talked about the natural history of this I will place her on meloxicam  once daily instead of the ibuprofen  that she takes intermittently and she knows not to take both.  Offered her PT or some home directed exercises she would like to do an start with home directed exercises which I gave her went over with her.  Will also go forward with a steroid injection today would like to see her back in 3 weeks  Follow-Up Instructions: No follow-ups on file.   Orders:  Orders Placed This Encounter  Procedures   XR Shoulder Right   XR Cervical Spine 2 or 3 views   No orders of the defined types were placed in this encounter.     Procedures: No procedures performed   Clinical Data: No additional findings.   Subjective: Chief Complaint  Patient presents with   Right Shoulder - Pain   Neck - Pain    HPI Patient is a pleasant 64 year old woman comes in with a chief complaint of new onset of right shoulder pain she has had chronic pain in her left shoulder.  No known injury been going on for months she is left-handed and has difficulty doing things.  She uses  ibuprofen  Biofreeze she is not able to sleep sometimes pain radiates down her arm Review of Systems  All other systems reviewed and are negative.    Objective: Vital Signs: There were no vitals taken for this visit.  Physical Exam Constitutional:      Appearance: Normal appearance.  Skin:    General: Skin is warm and dry.  Neurological:     General: No focal deficit present.     Mental Status: She is alert and oriented to person, place, and time.     Ortho Exam Examination of her right shoulder she has forward elevation no drop arm test she does have some pain coming down.  She has pain with internal rotation behind her back she has good abductor strength external and internal rotation strength.  No pain with external rotation of her shoulder she does have a positive empty can test and positive speeds test.  She has good range of motion of her neck though has some reproduction of her neck pain when she turns to the left.  Grip strength is intact biceps tricep strength is intact she has intact sensation Specialty Comments:  No specialty comments available.  Imaging: No results  found.   PMFS History: There are no active problems to display for this patient.  Past Medical History:  Diagnosis Date   Sciatica     No family history on file.  Past Surgical History:  Procedure Laterality Date   BIOPSY  12/12/2021   Procedure: BIOPSY;  Surgeon: Burnette Fallow, MD;  Location: [facility];  Service: Endoscopy;;   COLONOSCOPY WITH PROPOFOL  N/A 12/12/2021   Procedure: COLONOSCOPY WITH PROPOFOL ;  Surgeon: Burnette Fallow, MD;  Location: [facility];  Service: Endoscopy;  Laterality: N/A;   POLYPECTOMY  12/12/2021   Procedure: POLYPECTOMY;  Surgeon: Burnette Fallow, MD;  Location: [facility];  Service: Endoscopy;;   Social History   Occupational History   Not on file  Tobacco Use   Smoking status: Never   Smokeless tobacco: Never  Vaping Use   Vaping status: Never Used   Substance and Sexual Activity   Alcohol use: Not on file   Drug use: Not on file   Sexual activity: Not on file        "

## 2024-12-17 ENCOUNTER — Ambulatory Visit: Admitting: Orthopaedic Surgery

## 2024-12-18 ENCOUNTER — Other Ambulatory Visit (INDEPENDENT_AMBULATORY_CARE_PROVIDER_SITE_OTHER)

## 2024-12-18 ENCOUNTER — Encounter: Payer: Self-pay | Admitting: Physician Assistant

## 2024-12-18 ENCOUNTER — Ambulatory Visit (INDEPENDENT_AMBULATORY_CARE_PROVIDER_SITE_OTHER): Admitting: Physician Assistant

## 2024-12-18 DIAGNOSIS — M25571 Pain in right ankle and joints of right foot: Secondary | ICD-10-CM

## 2025-01-12 ENCOUNTER — Ambulatory Visit: Admitting: Physician Assistant
